# Patient Record
Sex: Male | Born: 1980 | Race: Black or African American | Hispanic: No | Marital: Married | Smoking: Current every day smoker
Health system: Southern US, Community
[De-identification: ages and names within clinical notes are randomized; demographics above are authoritative.]

---
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
## 2009-05-09 ENCOUNTER — Emergency Department (HOSPITAL_COMMUNITY): Admission: EM | Admit: 2009-05-09 | Discharge: 2009-05-09 | Payer: Self-pay | Admitting: Family Medicine

## 2009-11-18 ENCOUNTER — Inpatient Hospital Stay (HOSPITAL_COMMUNITY): Admission: AC | Admit: 2009-11-18 | Discharge: 2009-11-24 | Payer: Self-pay

## 2009-11-18 ENCOUNTER — Ambulatory Visit: Payer: Self-pay | Admitting: Cardiology

## 2009-11-18 ENCOUNTER — Ambulatory Visit: Payer: Self-pay | Admitting: Emergency Medicine

## 2009-11-18 DIAGNOSIS — I4891 Unspecified atrial fibrillation: Secondary | ICD-10-CM | POA: Insufficient documentation

## 2009-11-18 DIAGNOSIS — J95850 Mechanical complication of respirator: Secondary | ICD-10-CM | POA: Insufficient documentation

## 2009-11-18 DIAGNOSIS — F17200 Nicotine dependence, unspecified, uncomplicated: Secondary | ICD-10-CM

## 2009-11-18 DIAGNOSIS — E669 Obesity, unspecified: Secondary | ICD-10-CM

## 2009-11-18 DIAGNOSIS — Z87448 Personal history of other diseases of urinary system: Secondary | ICD-10-CM

## 2009-11-19 ENCOUNTER — Encounter: Payer: Self-pay | Admitting: Pulmonary Disease

## 2009-11-21 ENCOUNTER — Ambulatory Visit: Payer: Self-pay | Admitting: Internal Medicine

## 2009-11-23 LAB — CONVERTED CEMR LAB
BUN: 24 mg/dL
CO2: 23 meq/L
Chloride: 104 meq/L
MCHC: 34.1 g/dL
Phosphorus: 3.6 mg/dL
Potassium: 3.5 meq/L
Sodium: 137 meq/L

## 2009-12-03 ENCOUNTER — Encounter (INDEPENDENT_AMBULATORY_CARE_PROVIDER_SITE_OTHER): Payer: Self-pay | Admitting: Nurse Practitioner

## 2009-12-08 ENCOUNTER — Inpatient Hospital Stay (HOSPITAL_COMMUNITY): Admission: EM | Admit: 2009-12-08 | Discharge: 2009-12-08 | Payer: Self-pay | Admitting: Emergency Medicine

## 2009-12-12 ENCOUNTER — Ambulatory Visit: Payer: Self-pay | Admitting: Nurse Practitioner

## 2009-12-12 DIAGNOSIS — I10 Essential (primary) hypertension: Secondary | ICD-10-CM

## 2009-12-12 DIAGNOSIS — Z8669 Personal history of other diseases of the nervous system and sense organs: Secondary | ICD-10-CM | POA: Insufficient documentation

## 2010-01-09 ENCOUNTER — Ambulatory Visit: Payer: Self-pay | Admitting: Physician Assistant

## 2010-06-25 NOTE — Letter (Signed)
Summary: [place] [facility]  [place] [facility]   Imported By: Arta Bruce 01/03/2010 09:49:31  _____________________________________________________________________  External Attachment:    Type:   Image     Comment:   External Document

## 2010-06-25 NOTE — Letter (Signed)
Summary: PT INFORMATION SHEET  PT INFORMATION SHEET   Imported By: Arta Bruce 12/13/2009 14:49:02  _____________________________________________________________________  External Attachment:    Type:   Image     Comment:   External Document
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
## 2010-08-10 LAB — CBC
HCT: 40.7 % (ref 39.0–52.0)
HCT: 40.9 % (ref 39.0–52.0)
MCH: 28.5 pg (ref 26.0–34.0)
MCH: 29.5 pg (ref 26.0–34.0)
MCHC: 34.9 g/dL (ref 30.0–36.0)
MCV: 84.5 fL (ref 78.0–100.0)
Platelets: 266 10*3/uL (ref 150–400)
RBC: 4.82 MIL/uL (ref 4.22–5.81)
RDW: 13.2 % (ref 11.5–15.5)

## 2010-08-10 LAB — DIFFERENTIAL
Basophils Absolute: 0.1 10*3/uL (ref 0.0–0.1)
Basophils Absolute: 0.2 10*3/uL — ABNORMAL HIGH (ref 0.0–0.1)
Basophils Relative: 1 % (ref 0–1)
Basophils Relative: 3 % — ABNORMAL HIGH (ref 0–1)
Eosinophils Absolute: 0.2 10*3/uL (ref 0.0–0.7)
Eosinophils Absolute: 0.2 10*3/uL (ref 0.0–0.7)
Eosinophils Relative: 2 % (ref 0–5)
Eosinophils Relative: 3 % (ref 0–5)
Lymphocytes Relative: 32 % (ref 12–46)
Lymphs Abs: 2.8 10*3/uL (ref 0.7–4.0)
Monocytes Relative: 10 % (ref 3–12)
Neutro Abs: 4.9 10*3/uL (ref 1.7–7.7)
Neutrophils Relative %: 51 % (ref 43–77)

## 2010-08-10 LAB — CARDIAC PANEL(CRET KIN+CKTOT+MB+TROPI)
Relative Index: INVALID (ref 0.0–2.5)
Troponin I: 0.01 ng/mL (ref 0.00–0.06)
Troponin I: 0.01 ng/mL (ref 0.00–0.06)

## 2010-08-10 LAB — BASIC METABOLIC PANEL
BUN: 10 mg/dL (ref 6–23)
CO2: 32 mEq/L (ref 19–32)
Chloride: 98 mEq/L (ref 96–112)
GFR calc Af Amer: >60 mL/min (ref >60)
GFR calc non Af Amer: 51 mL/min — ABNORMAL LOW (ref >60)

## 2010-08-10 LAB — POCT CARDIAC MARKERS
CKMB, poc: <1 ng/mL — ABNORMAL LOW (ref 1.0–8.0)
Myoglobin, poc: 111 ng/mL (ref 12–200)
Troponin i, poc: <0.05 ng/mL (ref 0.00–0.09)

## 2010-08-10 LAB — CK TOTAL AND CKMB (NOT AT ARMC): CK, MB: 0.6 ng/mL (ref 0.3–4.0)

## 2010-08-10 LAB — POCT I-STAT, CHEM 8
BUN: 10 mg/dL (ref 6–23)
Calcium, Ion: 1.09 mmol/L — ABNORMAL LOW (ref 1.12–1.32)
Chloride: 101 mEq/L (ref 96–112)
Creatinine, Ser: 1.7 mg/dL — ABNORMAL HIGH (ref 0.4–1.5)
Glucose, Bld: 91 mg/dL (ref 70–99)
Potassium: 3.4 mEq/L — ABNORMAL LOW (ref 3.5–5.1)
Sodium: 138 mEq/L (ref 135–145)

## 2010-08-10 LAB — TROPONIN I: Troponin I: <0.01 ng/mL (ref 0.00–0.06)

## 2010-08-10 LAB — PROTIME-INR: Prothrombin Time: 14.7 seconds (ref 11.6–15.2)

## 2010-08-11 LAB — PROTEIN ELECTROPHORESIS, SERUM
Beta 2: 6.2 % (ref 3.2–6.5)
Beta Globulin: 6.3 % (ref 4.7–7.2)
M-Spike, %: NOT DETECTED g/dL

## 2010-08-11 LAB — CARDIAC PANEL(CRET KIN+CKTOT+MB+TROPI)
CK, MB: 2.6 ng/mL (ref 0.3–4.0)
CK, MB: 2.8 ng/mL (ref 0.3–4.0)
Relative Index: 0.3 (ref 0.0–2.5)

## 2010-08-11 LAB — GC/CHLAMYDIA PROBE AMP, URINE: Chlamydia, Swab/Urine, PCR: NEGATIVE

## 2010-08-11 LAB — BASIC METABOLIC PANEL
BUN: 11 mg/dL (ref 6–23)
BUN: 22 mg/dL (ref 6–23)
CO2: 20 mEq/L (ref 19–32)
CO2: 23 mEq/L (ref 19–32)
CO2: 23 mEq/L (ref 19–32)
Calcium: 8 mg/dL — ABNORMAL LOW (ref 8.4–10.5)
Calcium: 8.1 mg/dL — ABNORMAL LOW (ref 8.4–10.5)
Chloride: 109 mEq/L (ref 96–112)
Chloride: 112 mEq/L (ref 96–112)
Creatinine, Ser: 2.02 mg/dL — ABNORMAL HIGH (ref 0.4–1.5)
Creatinine, Ser: 2.74 mg/dL — ABNORMAL HIGH (ref 0.4–1.5)
Creatinine, Ser: 3.55 mg/dL — ABNORMAL HIGH (ref 0.4–1.5)
GFR calc Af Amer: 26 mL/min — ABNORMAL LOW (ref >60)
GFR calc non Af Amer: 21 mL/min — ABNORMAL LOW (ref >60)
GFR calc non Af Amer: 28 mL/min — ABNORMAL LOW (ref >60)
GFR calc non Af Amer: 40 mL/min — ABNORMAL LOW (ref >60)
Glucose, Bld: 114 mg/dL — ABNORMAL HIGH (ref 70–99)
Glucose, Bld: 91 mg/dL (ref 70–99)
Glucose, Bld: 91 mg/dL (ref 70–99)
Potassium: 3.5 mEq/L (ref 3.5–5.1)
Potassium: 3.9 mEq/L (ref 3.5–5.1)
Potassium: 4.1 mEq/L (ref 3.5–5.1)
Sodium: 138 mEq/L (ref 135–145)
Sodium: 141 mEq/L (ref 135–145)

## 2010-08-11 LAB — GLUCOSE, CAPILLARY
Glucose-Capillary: 103 mg/dL — ABNORMAL HIGH (ref 70–99)
Glucose-Capillary: 106 mg/dL — ABNORMAL HIGH (ref 70–99)
Glucose-Capillary: 109 mg/dL — ABNORMAL HIGH (ref 70–99)
Glucose-Capillary: 117 mg/dL — ABNORMAL HIGH (ref 70–99)
Glucose-Capillary: 165 mg/dL — ABNORMAL HIGH (ref 70–99)
Glucose-Capillary: 78 mg/dL (ref 70–99)

## 2010-08-11 LAB — URINE CULTURE: Colony Count: NO GROWTH

## 2010-08-11 LAB — POCT I-STAT, CHEM 8
BUN: 8 mg/dL (ref 6–23)
Calcium, Ion: 1.15 mmol/L (ref 1.12–1.32)
Creatinine, Ser: 1.6 mg/dL — ABNORMAL HIGH (ref 0.4–1.5)
Glucose, Bld: 142 mg/dL — ABNORMAL HIGH (ref 70–99)
Sodium: 139 meq/L (ref 135–145)
TCO2: 16 mmol/L (ref 0–100)

## 2010-08-11 LAB — CSF CELL COUNT WITH DIFFERENTIAL
Segmented Neutrophils-CSF: 1 % (ref 0–6)
WBC, CSF: 27 /mm3 (ref 0–5)

## 2010-08-11 LAB — CULTURE, BLOOD (ROUTINE X 2): Culture: NO GROWTH

## 2010-08-11 LAB — CBC
HCT: 37.4 % — ABNORMAL LOW (ref 39.0–52.0)
HCT: 36 % — ABNORMAL LOW (ref 39.0–52.0)
HCT: 36.3 % — ABNORMAL LOW (ref 39.0–52.0)
HCT: 48.7 % (ref 39.0–52.0)
Hemoglobin: 12.1 g/dL — ABNORMAL LOW (ref 13.0–17.0)
Hemoglobin: 12.3 g/dL — ABNORMAL LOW (ref 13.0–17.0)
Hemoglobin: 13.7 g/dL (ref 13.0–17.0)
Hemoglobin: 16.3 g/dL (ref 13.0–17.0)
MCH: 29.1 pg (ref 26.0–34.0)
MCH: 29 pg (ref 26.0–34.0)
MCHC: 33.9 g/dL (ref 30.0–36.0)
MCHC: 34 g/dL (ref 30.0–36.0)
MCV: 85.3 fL (ref 78.0–100.0)
MCV: 84.7 fL (ref 78.0–100.0)
MCV: 85.5 fL (ref 78.0–100.0)
MCV: 85.7 fL (ref 78.0–100.0)
Platelets: 252 10*3/uL (ref 150–400)
Platelets: 190 10*3/uL (ref 150–400)
Platelets: 201 10*3/uL (ref 150–400)
Platelets: 209 10*3/uL (ref 150–400)
RBC: 4.18 MIL/uL — ABNORMAL LOW (ref 4.22–5.81)
RBC: 4.24 MIL/uL (ref 4.22–5.81)
RBC: 4.68 MIL/uL (ref 4.22–5.81)
RBC: 5.56 MIL/uL (ref 4.22–5.81)
RDW: 13.6 % (ref 11.5–15.5)
RDW: 13.3 % (ref 11.5–15.5)
WBC: 9.2 10*3/uL (ref 4.0–10.5)
WBC: 11.6 10*3/uL — ABNORMAL HIGH (ref 4.0–10.5)
WBC: 13.1 10*3/uL — ABNORMAL HIGH (ref 4.0–10.5)

## 2010-08-11 LAB — URINALYSIS, ROUTINE W REFLEX MICROSCOPIC
Glucose, UA: NEGATIVE mg/dL
Protein, ur: 100 mg/dL — AB

## 2010-08-11 LAB — HEPARIN LEVEL (UNFRACTIONATED)
Heparin Unfractionated: 0.14 IU/mL — ABNORMAL LOW (ref 0.30–0.70)
Heparin Unfractionated: 0.41 IU/mL (ref 0.30–0.70)
Heparin Unfractionated: <0.1 IU/mL — ABNORMAL LOW (ref 0.30–0.70)

## 2010-08-11 LAB — BLOOD GAS, ARTERIAL
Acid-base deficit: 2.2 mmol/L — ABNORMAL HIGH (ref 0.0–2.0)
Bicarbonate: 20.6 mEq/L (ref 20.0–24.0)
Drawn by: 13898
FIO2: 0.3 %
MECHVT: 700 mL
MECHVT: 700 mL
O2 Saturation: 95.5 %
PEEP: 5 cmH2O
Patient temperature: 98.1
Patient temperature: 98.6
Patient temperature: 98.6
RATE: 16 resp/min
TCO2: 21.8 mmol/L (ref 0–100)
TCO2: 22.3 mmol/L (ref 0–100)
TCO2: 22.6 mmol/L (ref 0–100)
pCO2 arterial: 34.2 mmHg — ABNORMAL LOW (ref 35.0–45.0)
pH, Arterial: 7.41 (ref 7.350–7.450)
pH, Arterial: 7.422 (ref 7.350–7.450)

## 2010-08-11 LAB — DIFFERENTIAL
Eosinophils Relative: 1 % (ref 0–5)
Lymphocytes Relative: 11 % — ABNORMAL LOW (ref 12–46)
Lymphs Abs: 1.2 10*3/uL (ref 0.7–4.0)
Monocytes Absolute: 1.3 10*3/uL — ABNORMAL HIGH (ref 0.1–1.0)
Monocytes Relative: 11 % (ref 3–12)

## 2010-08-11 LAB — CSF CULTURE W GRAM STAIN
Culture: NO GROWTH
Gram Stain: NONE SEEN

## 2010-08-11 LAB — PROTEIN AND GLUCOSE, CSF: Total  Protein, CSF: 63 mg/dL — ABNORMAL HIGH (ref 15–45)

## 2010-08-11 LAB — GRAM STAIN

## 2010-08-11 LAB — RENAL FUNCTION PANEL
BUN: 24 mg/dL — ABNORMAL HIGH (ref 6–23)
CO2: 23 mEq/L (ref 19–32)
CO2: 27 mEq/L (ref 19–32)
CO2: 27 mEq/L (ref 19–32)
Calcium: 8.8 mg/dL (ref 8.4–10.5)
Calcium: 8.9 mg/dL (ref 8.4–10.5)
Chloride: 107 mEq/L (ref 96–112)
Chloride: 113 mEq/L — ABNORMAL HIGH (ref 96–112)
GFR calc Af Amer: 33 mL/min — ABNORMAL LOW (ref >60)
GFR calc Af Amer: 28 mL/min — ABNORMAL LOW (ref >60)
GFR calc non Af Amer: 27 mL/min — ABNORMAL LOW (ref >60)
GFR calc non Af Amer: 23 mL/min — ABNORMAL LOW (ref >60)
Glucose, Bld: 144 mg/dL — ABNORMAL HIGH (ref 70–99)
Glucose, Bld: 90 mg/dL (ref 70–99)
Potassium: 3.5 mEq/L (ref 3.5–5.1)
Sodium: 137 mEq/L (ref 135–145)
Sodium: 141 mEq/L (ref 135–145)
Sodium: 146 mEq/L — ABNORMAL HIGH (ref 135–145)

## 2010-08-11 LAB — COMPREHENSIVE METABOLIC PANEL
ALT: 43 U/L (ref 0–53)
AST: 39 U/L — ABNORMAL HIGH (ref 0–37)
Alkaline Phosphatase: 82 U/L (ref 39–117)
CO2: 14 mEq/L — ABNORMAL LOW (ref 19–32)
Chloride: 103 mEq/L (ref 96–112)
GFR calc Af Amer: 57 mL/min — ABNORMAL LOW (ref >60)
GFR calc non Af Amer: 47 mL/min — ABNORMAL LOW (ref >60)
Potassium: 3.9 mEq/L (ref 3.5–5.1)
Sodium: 140 mEq/L (ref 135–145)
Total Bilirubin: 0.7 mg/dL (ref 0.3–1.2)

## 2010-08-11 LAB — UIFE/LIGHT CHAINS/TP QN, 24-HR UR
Free Kappa Lt Chains,Ur: 21.5 mg/dL — ABNORMAL HIGH (ref 0.04–1.51)
Free Kappa/Lambda Ratio: 7.36 ratio — ABNORMAL HIGH (ref 0.46–4.00)
Free Lambda Excretion/Day: 52.56 mg/d
Free Lambda Lt Chains,Ur: 2.92 mg/dL — ABNORMAL HIGH (ref 0.08–1.01)
Free Lt Chn Excr Rate: 387 mg/d
Time: 24 h
Volume, Urine: 1800 mL

## 2010-08-11 LAB — URINE MICROSCOPIC-ADD ON

## 2010-08-11 LAB — CREATININE CLEARANCE, URINE, 24 HOUR
Creatinine Clearance: 55 mL/min — ABNORMAL LOW (ref 75–125)
Creatinine, 24H Ur: 2810 mg/d — ABNORMAL HIGH (ref 800–2000)

## 2010-08-11 LAB — CULTURE, RESPIRATORY W GRAM STAIN

## 2010-08-11 LAB — POCT I-STAT 3, ART BLOOD GAS (G3+): pH, Arterial: 7.282 — ABNORMAL LOW (ref 7.350–7.450)

## 2010-08-11 LAB — ROCKY MTN SPOTTED FVR AB, IGG-BLOOD: RMSF IgG: 0.06 IV

## 2010-08-11 LAB — SEDIMENTATION RATE: Sed Rate: 70 mm/hr — ABNORMAL HIGH (ref 0–16)

## 2010-08-11 LAB — RAPID URINE DRUG SCREEN, HOSP PERFORMED
Barbiturates: NOT DETECTED
Benzodiazepines: NOT DETECTED

## 2010-08-11 LAB — PROTIME-INR: Prothrombin Time: 15.2 seconds (ref 11.6–15.2)

## 2010-08-11 LAB — CK TOTAL AND CKMB (NOT AT ARMC): Total CK: 317 U/L — ABNORMAL HIGH (ref 7–232)

## 2010-08-11 LAB — VIRUS CULTURE: Preliminary Culture: NEGATIVE

## 2010-08-11 LAB — HERPES SIMPLEX VIRUS(HSV) DNA BY PCR: HSV 2 DNA: NOT DETECTED

## 2010-08-11 LAB — LACTIC ACID, PLASMA: Lactic Acid, Venous: 3.7 mmol/L — ABNORMAL HIGH (ref 0.5–2.2)

## 2010-08-11 LAB — HIV ANTIBODY (ROUTINE TESTING W REFLEX): HIV: NONREACTIVE

## 2012-05-22 IMAGING — CR DG CHEST 2V
2 series · 2 of 2 positions shown · non-contrast
Comparison: None.

CLINICAL DATA: Left-sided chest pain and cough; history of smoking
and hypertension.

CHEST - 2 VIEW

[w chest pa]
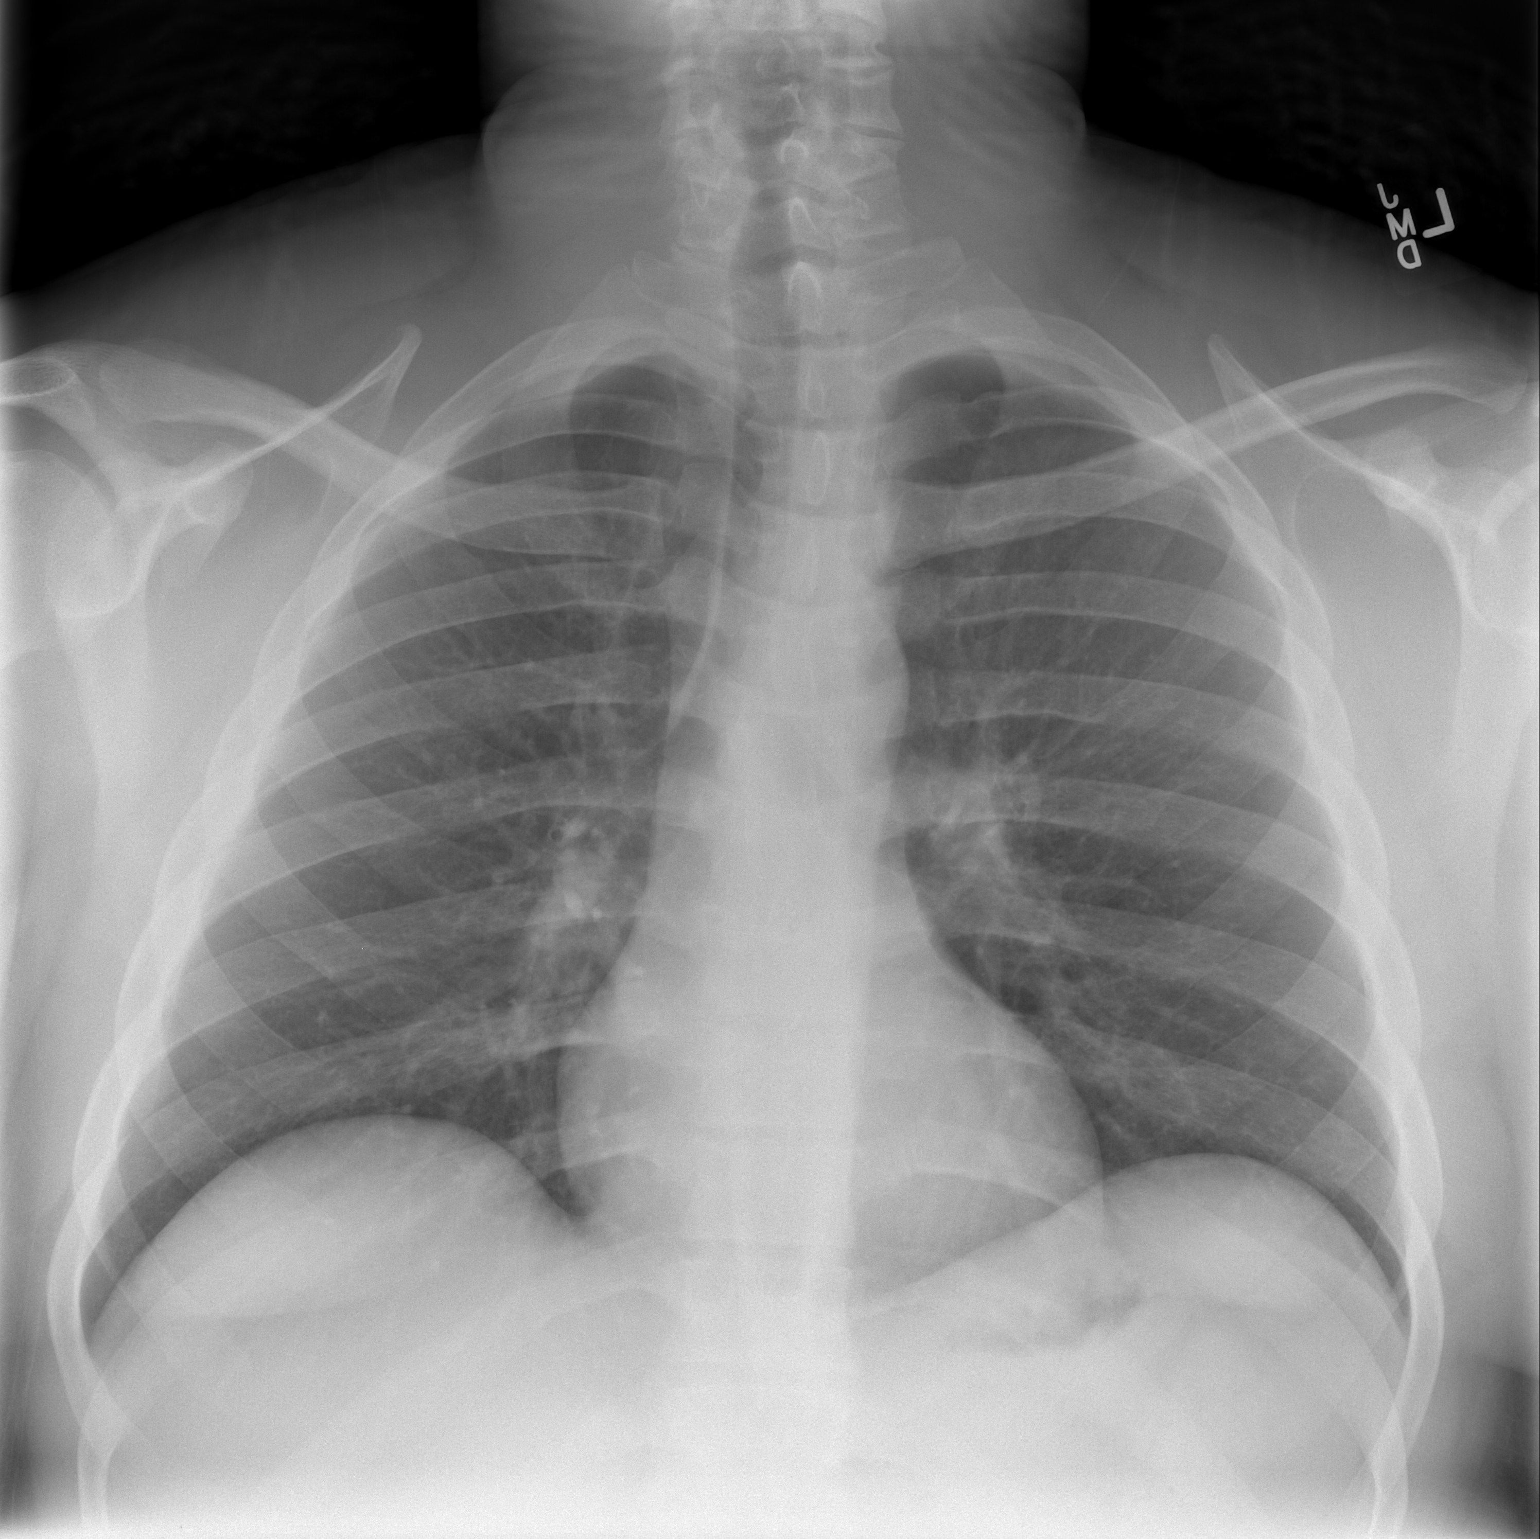

[w chest lat]
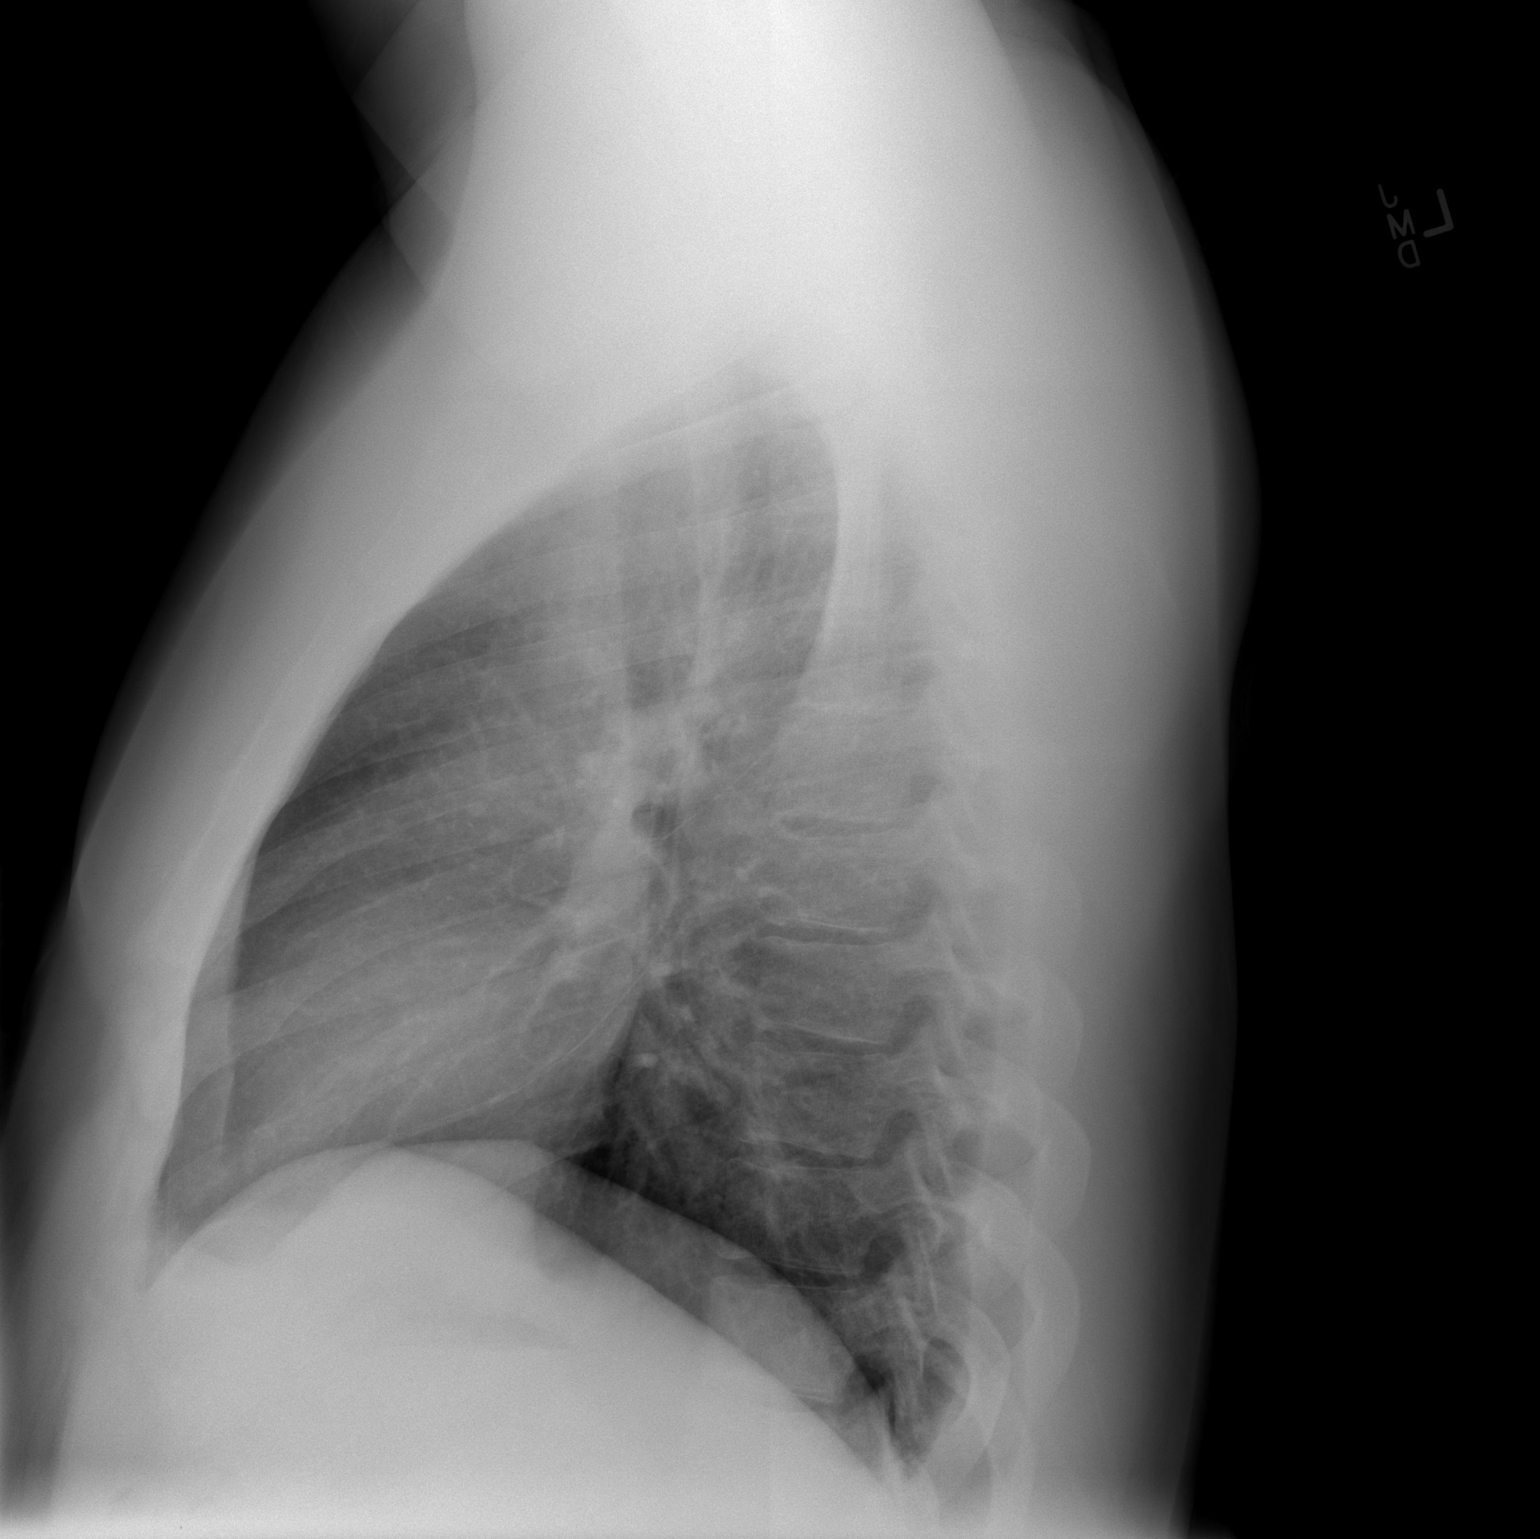

[2 of 2 positions shown; findings below may reference images not displayed]

FINDINGS: The lungs are well-aerated and clear.  There is no
evidence of focal opacification, pleural effusion or pneumothorax.

The heart is normal in size; the mediastinal contour is within
normal limits.  No acute osseous abnormalities are seen.
IMPRESSION: No acute cardiopulmonary process seen.

## 2012-05-22 IMAGING — NM NM PULM PERFUSION & VENT (REBREATHING & WASHOUT)
2 series · 12 of 12 positions shown · non-contrast
Comparison: None
Correlation:  Chest radiograph 12/08/2009

CLINICAL DATA: Pleuritic chest pain, shortness of breath, elevated
D-dimer, question pulmonary embolism

NUCLEAR MEDICINE VENTILATION - PERFUSION LUNG SCAN
TECHNIQUE: Wash-in, equilibrium, and wash-out phase ventilation
images were obtained using We-M77 gas.  Perfusion images were
obtained in multiple projections after intravenous injection of Tc-
99m MAA.
Radiopharmaceuticals:  13.1  mCi We-M77 gas and 4.8 mCi Zc-PPm MAA.

[Series 1: vq ventlung · 3.26mm/px · 6 of 17 frames shown (1 of 2)]
[frame 2/17  full-range]
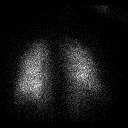
[frame 4/17  full-range]
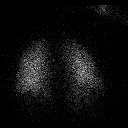
[frame 7/17]
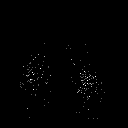
[frame 10/17]
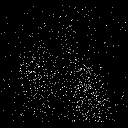
[frame 13/17]
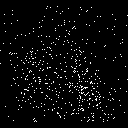
[frame 16/17]
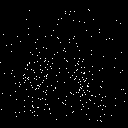

[Series 1: vq ventlung · 3.25mm/px · 6 of 17 frames shown (2 of 2)]
[frame 2/17  full-range]
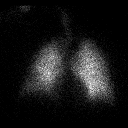
[frame 4/17  full-range]
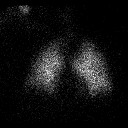
[frame 7/17  full-range]
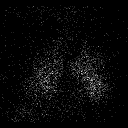
[frame 10/17]
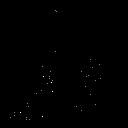
[frame 13/17]
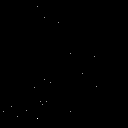
[frame 16/17]
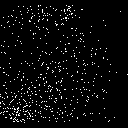

[12 of 12 positions shown; findings below may reference images not displayed]

FINDINGS: Ventilation exam in anterior and posterior projections demonstrates
diminished ventilation at anterior aspect of right lung base
laterally.
Remaining ventilation is normal.
No xenon retention.
Perfusion lung scan in eight projections is normal.
Lungs are clear on chest radiograph.
IMPRESSION: Normal perfusion lung scan.
Diminished ventilation at anterior aspect of right lung base
suggesting parenchymal lung disease.

## 2014-12-26 ENCOUNTER — Ambulatory Visit: Payer: Self-pay | Attending: Family Medicine | Admitting: Family Medicine

## 2014-12-26 ENCOUNTER — Ambulatory Visit: Payer: Self-pay | Admitting: Family Medicine

## 2014-12-26 ENCOUNTER — Encounter: Payer: Self-pay | Admitting: Family Medicine

## 2014-12-26 VITALS — BP 205/154 | HR 82 | Temp 98.8°F | Ht 74.0 in | Wt 297.0 lb

## 2014-12-26 DIAGNOSIS — I10 Essential (primary) hypertension: Secondary | ICD-10-CM

## 2014-12-26 MED ORDER — HYDROCHLOROTHIAZIDE 25 MG PO TABS: 25.0000 mg | ORAL_TABLET | Freq: Every day | ORAL | Status: AC

## 2014-12-26 MED ORDER — CLONIDINE HCL 0.1 MG PO TABS
0.1000 mg | ORAL_TABLET | Freq: Once | ORAL | Status: AC
  Administered 2014-12-26: 0.1 mg via ORAL

## 2014-12-26 MED ORDER — LOSARTAN POTASSIUM 100 MG PO TABS: 100.0000 mg | ORAL_TABLET | Freq: Every day | ORAL | Status: AC

## 2014-12-26 MED ORDER — CLONIDINE HCL 0.1 MG PO TABS: 0.1000 mg | ORAL_TABLET | Freq: Two times a day (BID) | ORAL | Status: DC

## 2014-12-26 NOTE — Progress Notes (Signed)
Patient here to establish care and discuss his HTN He takes clonidine, HCTZ, and losartan but is unsure of the dosage He has been out of medication for 1 day He smokes 0/5 ppd of cigarettes

## 2014-12-26 NOTE — Progress Notes (Signed)
   Subjective:    Patient ID: Tony Brewer, male    DOB: Aug 05, 1980, 34 y.o.   MRN: [id number]  HPI Tony Brewer is a 34 year old male with a history of hypertension who was going to [facility] up until a few days ago and has been out of his medications for two days. States he recently had blood work at his PCPs office and was informed that his labs were normal. He states he had been unsatisfied with the care he received there as it took a long time for him to receive refills of his medications. He endorses mild headache which she said is normal for him whenever he runs out of his medication otherwise denies chest pain , shortness of breath or blurry vision.  His blood pressure is significantly elevated today.  No past medical history on file.  No past surgical history on file.   History   Social History  . Marital Status: Married    Spouse Name: N/A  . Number of Children: N/A  . Years of Education: N/A   Occupational History  . Not on file.   Social History Main Topics  . Smoking status: Not on file  . Smokeless tobacco: Not on file  . Alcohol Use: Not on file  . Drug Use: Not on file  . Sexual Activity: Not on file   Other Topics Concern  . Not on file   Social History Narrative  . No narrative on file    Allergies  Allergen Reactions  . Penicillins         Review of Systems  Constitutional: Negative for activity change and appetite change.  HENT: Negative for sinus pressure and sore throat.   Eyes: Negative for visual disturbance.  Respiratory: Negative for cough, chest tightness and shortness of breath.   Cardiovascular: Negative for chest pain and leg swelling.  Gastrointestinal: Negative for abdominal pain, diarrhea, constipation and abdominal distention.  Endocrine: Negative.   Genitourinary: Negative for dysuria.  Musculoskeletal: Negative for myalgias and joint swelling.  Skin: Negative for rash.  Allergic/Immunologic: Negative.     Neurological: Positive for headaches. Negative for weakness, light-headedness and numbness.  Psychiatric/Behavioral: Negative for suicidal ideas and dysphoric mood.         Objective: Filed Vitals:   12/26/14 1641  BP: 197/131  Pulse: 80  Temp: 98.8 F (37.1 C)  Height:  (1.88 m)  Weight: 297 lb (134.718 kg)  SpO2: 96%      Physical Exam  Constitutional: He is oriented to person, place, and time. He appears well-developed and well-nourished.  Cardiovascular: Normal rate, normal heart sounds and intact distal pulses.   No murmur heard. Pulmonary/Chest: Effort normal and breath sounds normal. He has no wheezes. He has no rales. He exhibits no tenderness.  Abdominal: Soft. Bowel sounds are normal. He exhibits no distension and no mass. There is no tenderness.  Musculoskeletal: Normal range of motion.  Neurological: He is alert and oriented to person, place, and time.  Psychiatric: He has a normal mood and affect.          Assessment & Plan:   34 year old male with accelerated hypertension in the clinic secondary to running out of antihypertensive.  -Clonidine 0.1 mg given in the clinic ; patient observed for 20 minutes and repeat blood pressure taken. -Refilled all antihypertensives and will reassess blood pressure at next office visit. -Advised on low-sodium , DASH diet and increasing physical activity.

## 2014-12-26 NOTE — Patient Instructions (Signed)

## 2014-12-29 ENCOUNTER — Ambulatory Visit: Payer: Self-pay | Admitting: Internal Medicine

## 2015-01-09 ENCOUNTER — Ambulatory Visit: Payer: Self-pay | Admitting: Family Medicine

## 2015-03-08 ENCOUNTER — Other Ambulatory Visit: Payer: Self-pay | Admitting: Family Medicine

## 2015-04-12 ENCOUNTER — Telehealth: Payer: Self-pay

## 2015-04-12 DIAGNOSIS — I10 Essential (primary) hypertension: Secondary | ICD-10-CM

## 2015-04-12 MED ORDER — CLONIDINE HCL 0.1 MG PO TABS: 0.1000 mg | ORAL_TABLET | Freq: Two times a day (BID) | ORAL | Status: AC

## 2015-04-12 NOTE — Telephone Encounter (Signed)
Nurse called home number, reached recording explaining person is not taking calls at this time.  Nurse called mobile number, reached recording explaining number is not in service.  Nurse called patient to make patient aware of need for appointment prior to additional refills.  Nurse sent 1 month refill of clonidine to pharmacy.

## 2015-07-18 ENCOUNTER — Other Ambulatory Visit: Payer: Self-pay | Admitting: Family Medicine

## 2022-11-06 DIAGNOSIS — B353 Tinea pedis: Secondary | ICD-10-CM | POA: Diagnosis not present

## 2022-11-06 DIAGNOSIS — B351 Tinea unguium: Secondary | ICD-10-CM | POA: Diagnosis not present

## 2023-03-25 DIAGNOSIS — L918 Other hypertrophic disorders of the skin: Secondary | ICD-10-CM | POA: Diagnosis not present

## 2023-03-25 DIAGNOSIS — D485 Neoplasm of uncertain behavior of skin: Secondary | ICD-10-CM | POA: Diagnosis not present

## 2023-03-25 DIAGNOSIS — D235 Other benign neoplasm of skin of trunk: Secondary | ICD-10-CM | POA: Diagnosis not present

## 2023-05-12 ENCOUNTER — Encounter (HOSPITAL_BASED_OUTPATIENT_CLINIC_OR_DEPARTMENT_OTHER): Payer: Self-pay | Admitting: Urology

## 2023-05-12 ENCOUNTER — Emergency Department (HOSPITAL_BASED_OUTPATIENT_CLINIC_OR_DEPARTMENT_OTHER)
Admission: EM | Admit: 2023-05-12 | Discharge: 2023-05-12 | Disposition: A | Discharge status: Home / Self Care | Payer: 59 | Attending: Emergency Medicine | Admitting: Emergency Medicine

## 2023-05-12 ENCOUNTER — Emergency Department (HOSPITAL_BASED_OUTPATIENT_CLINIC_OR_DEPARTMENT_OTHER): Payer: No Typology Code available for payment source

## 2023-05-12 DIAGNOSIS — M791 Myalgia, unspecified site: Secondary | ICD-10-CM | POA: Diagnosis not present

## 2023-05-12 DIAGNOSIS — Y92410 Unspecified street and highway as the place of occurrence of the external cause: Secondary | ICD-10-CM | POA: Diagnosis not present

## 2023-05-12 DIAGNOSIS — S39012A Strain of muscle, fascia and tendon of lower back, initial encounter: Secondary | ICD-10-CM | POA: Diagnosis not present

## 2023-05-12 DIAGNOSIS — Z79899 Other long term (current) drug therapy: Secondary | ICD-10-CM | POA: Insufficient documentation

## 2023-05-12 DIAGNOSIS — S3992XA Unspecified injury of lower back, initial encounter: Secondary | ICD-10-CM | POA: Diagnosis not present

## 2023-05-12 DIAGNOSIS — M545 Low back pain, unspecified: Secondary | ICD-10-CM | POA: Diagnosis not present

## 2023-05-12 MED ORDER — ACETAMINOPHEN 500 MG PO TABS
1000.0000 mg | ORAL_TABLET | Freq: Once | ORAL | Status: AC
  Administered 2023-05-12: 1000 mg via ORAL
  Filled 2023-05-12: qty 2

## 2023-05-12 MED ORDER — CYCLOBENZAPRINE HCL 10 MG PO TABS
10.0000 mg | ORAL_TABLET | Freq: Two times a day (BID) | ORAL | 0 refills | Status: AC | PRN
Start: 2023-05-12 — End: ?
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
## 2023-06-09 DIAGNOSIS — E559 Vitamin D deficiency, unspecified: Secondary | ICD-10-CM | POA: Diagnosis not present

## 2023-06-09 DIAGNOSIS — E269 Hyperaldosteronism, unspecified: Secondary | ICD-10-CM | POA: Diagnosis not present

## 2023-06-11 DIAGNOSIS — I152 Hypertension secondary to endocrine disorders: Secondary | ICD-10-CM | POA: Diagnosis not present

## 2023-06-11 DIAGNOSIS — E269 Hyperaldosteronism, unspecified: Secondary | ICD-10-CM | POA: Diagnosis not present

## 2023-06-11 DIAGNOSIS — M898X9 Other specified disorders of bone, unspecified site: Secondary | ICD-10-CM | POA: Diagnosis not present

## 2023-06-11 DIAGNOSIS — E559 Vitamin D deficiency, unspecified: Secondary | ICD-10-CM | POA: Diagnosis not present

## 2023-06-11 DIAGNOSIS — R739 Hyperglycemia, unspecified: Secondary | ICD-10-CM | POA: Diagnosis not present

## 2023-08-20 DIAGNOSIS — E785 Hyperlipidemia, unspecified: Secondary | ICD-10-CM | POA: Diagnosis not present

## 2023-08-20 DIAGNOSIS — I152 Hypertension secondary to endocrine disorders: Secondary | ICD-10-CM | POA: Diagnosis not present

## 2023-08-20 DIAGNOSIS — Z6841 Body Mass Index (BMI) 40.0 and over, adult: Secondary | ICD-10-CM | POA: Diagnosis not present

## 2023-08-20 DIAGNOSIS — R29818 Other symptoms and signs involving the nervous system: Secondary | ICD-10-CM | POA: Diagnosis not present

## 2023-08-20 DIAGNOSIS — Z23 Encounter for immunization: Secondary | ICD-10-CM | POA: Diagnosis not present

## 2023-08-20 DIAGNOSIS — K219 Gastro-esophageal reflux disease without esophagitis: Secondary | ICD-10-CM | POA: Diagnosis not present

## 2023-08-28 DIAGNOSIS — R0681 Apnea, not elsewhere classified: Secondary | ICD-10-CM | POA: Diagnosis not present

## 2023-10-09 DIAGNOSIS — G4733 Obstructive sleep apnea (adult) (pediatric): Secondary | ICD-10-CM | POA: Diagnosis not present

## 2023-10-12 DIAGNOSIS — G4733 Obstructive sleep apnea (adult) (pediatric): Secondary | ICD-10-CM | POA: Diagnosis not present
